# Patient Record
Sex: Male | Born: 1990
Health system: Southern US, Community
[De-identification: ages and names within clinical notes are randomized; demographics above are authoritative.]

## PROBLEM LIST (undated history)

## (undated) HISTORY — PX: HAND SURGERY: SHX662

---

## 2013-07-21 ENCOUNTER — Encounter (HOSPITAL_COMMUNITY): Payer: Self-pay | Admitting: Emergency Medicine

## 2013-07-21 ENCOUNTER — Emergency Department (HOSPITAL_COMMUNITY)
Admission: EM | Admit: 2013-07-21 | Discharge: 2013-07-21 | Disposition: A | Discharge status: Home / Self Care | Payer: BC Managed Care – PPO | Attending: Emergency Medicine | Admitting: Emergency Medicine

## 2013-07-21 DIAGNOSIS — F17200 Nicotine dependence, unspecified, uncomplicated: Secondary | ICD-10-CM | POA: Insufficient documentation

## 2013-07-21 DIAGNOSIS — K089 Disorder of teeth and supporting structures, unspecified: Secondary | ICD-10-CM | POA: Insufficient documentation

## 2013-07-21 DIAGNOSIS — K0520 Aggressive periodontitis, unspecified: Secondary | ICD-10-CM | POA: Insufficient documentation

## 2013-07-21 DIAGNOSIS — Z8781 Personal history of (healed) traumatic fracture: Secondary | ICD-10-CM | POA: Insufficient documentation

## 2013-07-21 DIAGNOSIS — K0889 Other specified disorders of teeth and supporting structures: Secondary | ICD-10-CM

## 2013-07-21 DIAGNOSIS — R51 Headache: Secondary | ICD-10-CM | POA: Insufficient documentation

## 2013-07-21 MED ORDER — OXYCODONE-ACETAMINOPHEN 5-325 MG PO TABS
2.0000 | ORAL_TABLET | Freq: Once | ORAL | Status: AC
  Administered 2013-07-21: 2 via ORAL
  Filled 2013-07-21: qty 2

## 2013-07-21 MED ORDER — OXYCODONE-ACETAMINOPHEN 5-325 MG PO TABS: 2.0000 | ORAL_TABLET | Freq: Four times a day (QID) | ORAL | Status: DC | PRN

## 2013-07-21 MED ORDER — PENICILLIN V POTASSIUM 500 MG PO TABS: 500.0000 mg | ORAL_TABLET | Freq: Three times a day (TID) | ORAL | Status: DC

## 2013-07-21 NOTE — ED Notes (Signed)
Patient states has dental pain/possible abscess left lower side.   Patient states broke a molar several years ago playing football.  Patient states has had pain off and on a while.   Patient states has not been to a dentist.

## 2013-07-21 NOTE — Discharge Instructions (Signed)

## 2013-07-21 NOTE — ED Provider Notes (Signed)
Medical screening examination/treatment/procedure(s) were performed by non-physician practitioner and as supervising physician I was immediately available for consultation/collaboration.   EKG Interpretation None      Devoria AlbeIva Lorilynn Lehr, MD, Armando GangFACEP   Ward GivensIva L Taksh Hjort, MD 07/21/13 [phone]

## 2013-07-21 NOTE — ED Provider Notes (Signed)
CSN: [id number]     Arrival date & time 07/21/13  1426 History  This chart was scribed for non-physician practitioner Roxy Horsemanobert Kaevion Sinclair, PA-C working with Ward GivensIva L Knapp, MD by Valera CastleSteven Perry, ED scribe. This patient was seen in room TR09C/TR09C and the patient's care was started at 3:20 PM.   Chief Complaint  Patient presents with  . Dental Pain   (Consider location/radiation/quality/duration/timing/severity/associated sxs/prior Treatment) The history is provided by the patient. No language interpreter was used.   Joel Mckenzie is a 23 y.o. male who presents to the Emergency Department with chief complaint of constantly painful abscess over his left lower mouth, with associated headache, onset yesterday. He reports h/o molar fx in the same area several years ago while playing football, having reoccurring abscesses to the area. He denies having been seen by dentist. He denies any other associated symptoms.   PCP - No PCP Per Patient  History reviewed. No pertinent past medical history. Past Surgical History  Procedure Laterality Date  . Hand surgery     No family history on file. History  Substance Use Topics  . Smoking status: Current Every Day Smoker -- 1.00 packs/day    Types: Cigarettes  . Smokeless tobacco: Not on file  . Alcohol Use: Yes    Review of Systems  Constitutional: Negative for fever.  HENT: Positive for dental problem (left lower) and facial swelling (left lower gum swelling).   Neurological: Positive for headaches.    Allergies  Review of patient's allergies indicates no known allergies.  Home Medications  No current outpatient prescriptions on file.  BP 135/78  Pulse 72  Temp(Src) 99.8 F (37.7 C) (Oral)  Resp 18  Ht 5\' 6"  (1.676 m)  Wt 180 lb (81.647 kg)  BMI 29.07 kg/m2  SpO2 98%  Physical Exam  Nursing note and vitals reviewed. Constitutional: He is oriented to person, place, and time. He appears well-developed and well-nourished. No distress.   HENT:  Head: Normocephalic and atraumatic.  Mouth/Throat:    Poor dentition throughout.  Affected tooth as diagrammed.  No signs of peritonsillar or tonsillar abscess.  No signs of gingival abscess. Oropharynx is clear and without exudates.  Uvula is midline.  Airway is intact. No signs of Ludwig's angina with palpation of oral and sublingual mucosa.   Eyes: EOM are normal.  Neck: Neck supple.  Cardiovascular: Normal rate.   Pulmonary/Chest: Effort normal. No respiratory distress.  Musculoskeletal: Normal range of motion.  Neurological: He is alert and oriented to person, place, and time.  Skin: Skin is warm and dry.  Psychiatric: He has a normal mood and affect. His behavior is normal.    ED Course  Procedures (including critical care time)  DIAGNOSTIC STUDIES: Oxygen Saturation is 98% on room air, normal by my interpretation.    COORDINATION OF CARE: 3:23 PM-Discussed treatment plan which includes I&D, antibiotic, and pain medicaiton with pt at bedside and pt agreed to plan.   INCISION AND DRAINAGE Performed by: Roxy HorsemanBROWNING, Olinda Nola Consent: Verbal consent obtained. Risks and benefits: risks, benefits and alternatives were discussed Type: abscess  Body area: left lower gingiva  Anesthesia: local infiltration  Incision was made with a scalpel.  Local anesthetic: Marcaine  Anesthetic total: 1.8 ml  Complexity: complex Blunt dissection to break up loculations  Drainage: purulent  Drainage amount: mild  Patient tolerance: Patient tolerated the procedure well with no immediate complications.    Labs Review Labs Reviewed - No data to display Imaging Review No results found.  EKG Interpretation None     Medications - No data to display  MDM   Final diagnoses:  Pain, dental   Patient with toothache.  Exam unconcerning for Ludwig's angina or spread of infection.  Will treat with penicillin and pain medicine.  Urged patient to follow-up with dentist.  I  attempted to I&D the gingival abscess, but there was only minimal discharge.  I discussed this with Dr. Deretha Emory, who recommends discharge to home with abx and dental follow-up.  Return precautions given. Patient understands and agrees with the plan.   I personally performed the services described in this documentation, which was scribed in my presence. The recorded information has been reviewed and is accurate.    Roxy Horseman, PA-C 07/21/13 [phone]

## 2014-10-19 ENCOUNTER — Emergency Department (HOSPITAL_BASED_OUTPATIENT_CLINIC_OR_DEPARTMENT_OTHER): Payer: Self-pay

## 2014-10-19 ENCOUNTER — Emergency Department (HOSPITAL_BASED_OUTPATIENT_CLINIC_OR_DEPARTMENT_OTHER)
Admission: EM | Admit: 2014-10-19 | Discharge: 2014-10-19 | Disposition: A | Discharge status: Home / Self Care | Payer: Self-pay | Attending: Emergency Medicine | Admitting: Emergency Medicine

## 2014-10-19 ENCOUNTER — Encounter (HOSPITAL_BASED_OUTPATIENT_CLINIC_OR_DEPARTMENT_OTHER): Payer: Self-pay | Admitting: *Deleted

## 2014-10-19 DIAGNOSIS — Z792 Long term (current) use of antibiotics: Secondary | ICD-10-CM | POA: Insufficient documentation

## 2014-10-19 DIAGNOSIS — Z720 Tobacco use: Secondary | ICD-10-CM | POA: Insufficient documentation

## 2014-10-19 DIAGNOSIS — J40 Bronchitis, not specified as acute or chronic: Secondary | ICD-10-CM | POA: Insufficient documentation

## 2014-10-19 LAB — RAPID STREP SCREEN (MED CTR MEBANE ONLY): Streptococcus, Group A Screen (Direct): NEGATIVE

## 2014-10-19 MED ORDER — PROMETHAZINE-DM 6.25-15 MG/5ML PO SYRP: 5.0000 mL | ORAL_SOLUTION | Freq: Four times a day (QID) | ORAL | Status: DC | PRN

## 2014-10-19 MED ORDER — ALBUTEROL SULFATE HFA 108 (90 BASE) MCG/ACT IN AERS
2.0000 | INHALATION_SPRAY | RESPIRATORY_TRACT | Status: DC | PRN
  Administered 2014-10-19: 2 via RESPIRATORY_TRACT
  Filled 2014-10-19: qty 6.7

## 2014-10-19 MED ORDER — PREDNISONE 50 MG PO TABS
60.0000 mg | ORAL_TABLET | Freq: Once | ORAL | Status: AC
  Administered 2014-10-19: 60 mg via ORAL
  Filled 2014-10-19 (×2): qty 1

## 2014-10-19 MED ORDER — AEROCHAMBER PLUS FLO-VU MEDIUM MISC
1.0000 | Freq: Once | Status: AC
  Administered 2014-10-19: 1
  Filled 2014-10-19: qty 1

## 2014-10-19 MED ORDER — PREDNISONE 50 MG PO TABS: 50.0000 mg | ORAL_TABLET | Freq: Every day | ORAL | Status: DC

## 2014-10-19 MED ORDER — IPRATROPIUM-ALBUTEROL 0.5-2.5 (3) MG/3ML IN SOLN
3.0000 mL | Freq: Once | RESPIRATORY_TRACT | Status: AC
Start: 2014-10-19 — End: 2014-10-19
  Administered 2014-10-19: 3 mL via RESPIRATORY_TRACT
  Filled 2014-10-19: qty 3

## 2014-10-19 MED ORDER — GUAIFENESIN ER 1200 MG PO TB12: 1.0000 | ORAL_TABLET | Freq: Two times a day (BID) | ORAL | Status: DC

## 2014-10-19 MED ORDER — ALBUTEROL (5 MG/ML) CONTINUOUS INHALATION SOLN
10.0000 mg/h | INHALATION_SOLUTION | RESPIRATORY_TRACT | Status: AC
  Administered 2014-10-19: 10 mg/h via RESPIRATORY_TRACT
  Filled 2014-10-19: qty 20

## 2014-10-19 MED ORDER — ACETAMINOPHEN 325 MG PO TABS
650.0000 mg | ORAL_TABLET | Freq: Once | ORAL | Status: AC
  Administered 2014-10-19: 650 mg via ORAL
  Filled 2014-10-19: qty 2

## 2014-10-19 NOTE — ED Notes (Signed)
Sob and cough. Recent URI.

## 2014-10-19 NOTE — ED Provider Notes (Signed)
CSN: [id number]     Arrival date & time 10/19/14  1644 History   First MD Initiated Contact with Patient 10/19/14 1653     Chief Complaint  Patient presents with  . Shortness of Breath     (Consider location/radiation/quality/duration/timing/severity/associated sxs/prior Treatment) HPI Patient presents to the emergency department with cough and shortness of breath started yesterday.  The patient states that he has had a recent upper respiratory illness.  Patient is a smoker.  He states that he does not have any past medical problems.  The patient denies chest pain, nausea, vomiting, weakness, dizziness, headache, blurred vision, back pain, neck pain, fever, abdominal pain, diarrhea, or syncope.  The patient states he has also has recently had a sore throat and some nasal congestion but the symptoms seemed to have subsided somewhat.  Patient states today that he was at work and he felt more short of breath with the cough. History reviewed. No pertinent past medical history. Past Surgical History  Procedure Laterality Date  . Hand surgery     No family history on file. History  Substance Use Topics  . Smoking status: Current Every Day Smoker -- 1.00 packs/day    Types: Cigarettes  . Smokeless tobacco: Not on file  . Alcohol Use: Yes    Review of Systems All other systems negative except as documented in the HPI. All pertinent positives and negatives as reviewed in the HPI.   Allergies  Review of patient's allergies indicates no known allergies.  Home Medications   Prior to Admission medications   Medication Sig Start Date End Date Taking? Authorizing Provider  oxyCODONE-acetaminophen (PERCOCET/ROXICET) 5-325 MG per tablet Take 2 tablets by mouth every 6 (six) hours as needed for severe pain. 07/21/13   Roxy Horsemanobert Browning, PA-C  penicillin v potassium (VEETID) 500 MG tablet Take 1 tablet (500 mg total) by mouth 3 (three) times daily. 07/21/13   Roxy Horsemanobert Browning, PA-C   BP 149/74 mmHg   Pulse 94  Temp(Src) 98.5 F (36.9 C) (Oral)  Resp 16  Ht 5\' 6"  (1.676 m)  Wt 193 lb (87.544 kg)  BMI 31.17 kg/m2  SpO2 100% Physical Exam  Constitutional: He appears well-developed and well-nourished. No distress.  HENT:  Head: Normocephalic and atraumatic.  Mouth/Throat: Oropharynx is clear and moist.  Eyes: Pupils are equal, round, and reactive to light.  Neck: Normal range of motion. Neck supple.  Cardiovascular: Normal rate, regular rhythm and normal heart sounds.  Exam reveals no gallop and no friction rub.   No murmur heard. Pulmonary/Chest: Effort normal. No respiratory distress. He has wheezes. He has no rales.  Musculoskeletal: He exhibits no edema.  Neurological: He is alert.  Skin: Skin is warm and dry. No rash noted. No erythema.  Psychiatric: He has a normal mood and affect. His behavior is normal.  Nursing note and vitals reviewed.   ED Course  Procedures (including critical care time) Labs Review Labs Reviewed  RAPID STREP SCREEN (NOT AT [facility])  CULTURE, GROUP A STREP    Imaging Review Dg Chest 2 View  10/19/2014   CLINICAL DATA:  Left-sided chest pain and shortness of breath for 1 day. Severe cough and vomiting.  EXAM: CHEST  2 VIEW  COMPARISON:  None.  FINDINGS: The heart size and mediastinal contours are within normal limits. Both lungs are clear. The visualized skeletal structures are unremarkable.  IMPRESSION: Negative.  No active cardiopulmonary disease.   Electronically Signed   By: Myles RosenthalJohn  Stahl M.D.   On:  10/19/2014 17:59   Patient most likely has bronchitis based on his history of present illness and physical exam findings, along with his x-ray results.  Patient is advised to return here as needed.  After an hour-long neb treatment.  The patient's breathing is vastly improved.  The patient feels considerably better.  Patient will be discharged home, told to return here as needed.  Patient's lungs are reexamined.  He does still have some congested breath  sounds but wheezing has dissipated and he is moving substantially more air at this time   Charlestine Night, PA-C 10/19/14 2015  Geoffery Lyons, MD 10/19/14 [phone]

## 2014-10-19 NOTE — Discharge Instructions (Signed)
Return here as needed.  Follow-up with the primary care doctor, increase your fluid intake and rest as much as possible, avoid smoking as much as possible as well

## 2014-10-21 LAB — CULTURE, GROUP A STREP

## 2016-04-03 ENCOUNTER — Encounter (HOSPITAL_BASED_OUTPATIENT_CLINIC_OR_DEPARTMENT_OTHER): Payer: Self-pay | Admitting: *Deleted

## 2016-04-03 ENCOUNTER — Emergency Department (HOSPITAL_BASED_OUTPATIENT_CLINIC_OR_DEPARTMENT_OTHER)
Admission: EM | Admit: 2016-04-03 | Discharge: 2016-04-03 | Disposition: A | Discharge status: Home / Self Care | Payer: Self-pay | Attending: Emergency Medicine | Admitting: Emergency Medicine

## 2016-04-03 DIAGNOSIS — A64 Unspecified sexually transmitted disease: Secondary | ICD-10-CM | POA: Insufficient documentation

## 2016-04-03 DIAGNOSIS — F17210 Nicotine dependence, cigarettes, uncomplicated: Secondary | ICD-10-CM | POA: Insufficient documentation

## 2016-04-03 LAB — URINALYSIS, ROUTINE W REFLEX MICROSCOPIC
Bilirubin Urine: NEGATIVE
Glucose, UA: NEGATIVE mg/dL
Ketones, ur: NEGATIVE mg/dL
Nitrite: NEGATIVE
Protein, ur: NEGATIVE mg/dL
SPECIFIC GRAVITY, URINE: 1.02 (ref 1.005–1.030)
pH: 6 (ref 5.0–8.0)

## 2016-04-03 LAB — URINE MICROSCOPIC-ADD ON

## 2016-04-03 MED ORDER — CEFTRIAXONE SODIUM 250 MG IJ SOLR
250.0000 mg | Freq: Once | INTRAMUSCULAR | Status: AC
  Administered 2016-04-03: 250 mg via INTRAMUSCULAR
  Filled 2016-04-03: qty 250

## 2016-04-03 MED ORDER — AZITHROMYCIN 250 MG PO TABS
1000.0000 mg | ORAL_TABLET | Freq: Once | ORAL | Status: AC
  Administered 2016-04-03: 1000 mg via ORAL
  Filled 2016-04-03: qty 4

## 2016-04-03 MED ORDER — LIDOCAINE HCL (PF) 1 % IJ SOLN
INTRAMUSCULAR | Status: AC
  Administered 2016-04-03: 5 mL
  Filled 2016-04-03: qty 5

## 2016-04-03 NOTE — ED Notes (Signed)
Pt d/c home. Follow up care discussed and pt verbalized understanding

## 2016-04-03 NOTE — ED Provider Notes (Signed)
[facility]   CSN: [id number] Arrival date & time: 04/03/16  1445     History   Chief Complaint Chief Complaint  Patient presents with  . Penile Discharge    HPI Joel Mckenzie is a 25 y.o. male.  HPI   25 year old male presents today with penile discharge. Patient reports symptoms started 2 days ago. He reports purulent discharge, denies any pain, rash, testicular swelling or discomfort. He denies any abdominal pain nausea vomiting or fever. Patient reports his sexual partner tested positive for chlamydia several days prior to symptom onset.   History reviewed. No pertinent past medical history.  There are no active problems to display for this patient.   Past Surgical History:  Procedure Laterality Date  . HAND SURGERY         Home Medications    Prior to Admission medications   Medication Sig Start Date End Date Taking? Authorizing Provider  Guaifenesin 1200 MG TB12 Take 1 tablet (1,200 mg total) by mouth 2 (two) times daily. 10/19/14   Charlestine Nighthristopher Lawyer, PA-C  oxyCODONE-acetaminophen (PERCOCET/ROXICET) 5-325 MG per tablet Take 2 tablets by mouth every 6 (six) hours as needed for severe pain. 07/21/13   Roxy Horsemanobert Browning, PA-C  penicillin v potassium (VEETID) 500 MG tablet Take 1 tablet (500 mg total) by mouth 3 (three) times daily. 07/21/13   Roxy Horsemanobert Browning, PA-C  predniSONE (DELTASONE) 50 MG tablet Take 1 tablet (50 mg total) by mouth daily. 10/19/14   Charlestine Nighthristopher Lawyer, PA-C  promethazine-dextromethorphan (PROMETHAZINE-DM) 6.25-15 MG/5ML syrup Take 5 mLs by mouth 4 (four) times daily as needed for cough. 10/19/14   Charlestine Nighthristopher Lawyer, PA-C    Family History No family history on file.  Social History Social History  Substance Use Topics  . Smoking status: Current Every Day Smoker    Packs/day: 1.00    Types: Cigarettes  . Smokeless tobacco: Never Used  . Alcohol use Yes     Allergies   Patient has no known allergies.   Review of  Systems Review of Systems  All other systems reviewed and are negative.   Physical Exam Updated Vital Signs BP 130/66 (BP Location: Right Arm)   Pulse 60   Temp 97.5 F (36.4 C) (Oral)   Resp 18   Ht 5' 5.5" (1.664 m)   Wt 87.5 kg   SpO2 99%   BMI 31.63 kg/m   Physical Exam  Constitutional: He is oriented to person, place, and time. He appears well-developed and well-nourished.  HENT:  Head: Normocephalic and atraumatic.  Eyes: Conjunctivae are normal. Pupils are equal, round, and reactive to light. Right eye exhibits no discharge. Left eye exhibits no discharge. No scleral icterus.  Neck: Normal range of motion. No JVD present. No tracheal deviation present.  Pulmonary/Chest: Effort normal. No stridor.  Neurological: He is alert and oriented to person, place, and time. Coordination normal.  Psychiatric: He has a normal mood and affect. His behavior is normal. Judgment and thought content normal.  Nursing note and vitals reviewed.   ED Treatments / Results  Labs (all labs ordered are listed, but only abnormal results are displayed) Labs Reviewed  URINALYSIS, ROUTINE W REFLEX MICROSCOPIC (NOT AT [facility]) - Abnormal; Notable for the following:       Result Value   APPearance CLOUDY (*)    Hgb urine dipstick SMALL (*)    Leukocytes, UA LARGE (*)    All other components within normal limits  URINE MICROSCOPIC-ADD ON - Abnormal; Notable for  the following:    Squamous Epithelial / LPF 0-5 (*)    Bacteria, UA RARE (*)    All other components within normal limits  HIV ANTIBODY (ROUTINE TESTING)  RPR  GC/CHLAMYDIA PROBE AMP ([facility]) NOT AT [facility]    EKG  EKG Interpretation None       Radiology No results found.  Procedures Procedures (including critical care time)  Medications Ordered in ED Medications  azithromycin (ZITHROMAX) tablet 1,000 mg (1,000 mg Oral Given 04/03/16 1539)  cefTRIAXone (ROCEPHIN) injection 250 mg (250 mg Intramuscular Given 04/03/16 1540)   lidocaine (PF) (XYLOCAINE) 1 % injection (5 mLs  Given 04/03/16 1539)     Initial Impression / Assessment and Plan / ED Course  I have reviewed the triage vital signs and the nursing notes.  Pertinent labs & imaging results that were available during my care of the patient were reviewed by me and considered in my medical decision making (see chart for details).  Clinical Course     Final Clinical Impressions(s) / ED Diagnoses   Final diagnoses:  STD (male)    Labs:  Imaging:  Consults:  Therapeutics:  Discharge Meds:   Assessment/Plan:  25 year old male presents today with penile discharge with known STD contact. He has no complaints of that discharge, he'll be treated prophylactically with urine analysis sent off. Return precautions given.       New Prescriptions New Prescriptions   No medications on file     Eyvonne MechanicJeffrey Haruki Arnold, PA-C 04/03/16 1721    Rolan BuccoMelanie Belfi, MD 04/03/16 2032

## 2016-04-03 NOTE — ED Triage Notes (Signed)
Penile discharge x 2 days 

## 2016-04-03 NOTE — Discharge Instructions (Signed)
Please read attached information. If you experience any new or worsening signs or symptoms please return to the emergency room for evaluation. Please follow-up with your primary care provider or specialist as discussed.  °

## 2016-04-04 LAB — RPR: RPR Ser Ql: NONREACTIVE

## 2016-04-04 LAB — HIV ANTIBODY (ROUTINE TESTING W REFLEX): HIV Screen 4th Generation wRfx: NONREACTIVE

## 2016-04-06 ENCOUNTER — Emergency Department (HOSPITAL_BASED_OUTPATIENT_CLINIC_OR_DEPARTMENT_OTHER)
Admission: EM | Admit: 2016-04-06 | Discharge: 2016-04-06 | Disposition: A | Discharge status: Home / Self Care | Payer: Self-pay | Attending: Emergency Medicine | Admitting: Emergency Medicine

## 2016-04-06 ENCOUNTER — Encounter (HOSPITAL_BASED_OUTPATIENT_CLINIC_OR_DEPARTMENT_OTHER): Payer: Self-pay | Admitting: *Deleted

## 2016-04-06 DIAGNOSIS — R079 Chest pain, unspecified: Secondary | ICD-10-CM

## 2016-04-06 DIAGNOSIS — R071 Chest pain on breathing: Secondary | ICD-10-CM | POA: Insufficient documentation

## 2016-04-06 DIAGNOSIS — F17210 Nicotine dependence, cigarettes, uncomplicated: Secondary | ICD-10-CM | POA: Insufficient documentation

## 2016-04-06 LAB — GC/CHLAMYDIA PROBE AMP (~~LOC~~) NOT AT ARMC
Chlamydia: NEGATIVE
Neisseria Gonorrhea: POSITIVE — AB

## 2016-04-06 MED ORDER — NAPROXEN 250 MG PO TABS
500.0000 mg | ORAL_TABLET | Freq: Once | ORAL | Status: AC
  Administered 2016-04-06: 500 mg via ORAL
  Filled 2016-04-06: qty 2

## 2016-04-06 MED ORDER — NAPROXEN 500 MG PO TABS: 500.0000 mg | ORAL_TABLET | Freq: Two times a day (BID) | ORAL | 0 refills | Status: DC

## 2016-04-06 NOTE — Discharge Instructions (Signed)
Take naproxen as prescribed. As we discussed there are many causes of chest pain including muscle strain or stress/anxiety. Follow up with your primary care provider. If you do not have one you may call one from the list provided. Return to the ER for new or worsening symptoms.

## 2016-04-06 NOTE — ED Notes (Signed)
Pt states the pain hurts with inspiration and certain movements. Pt endorses lifting objects at work which makes the pain worse. Pt also states he has been doing weightlifting exercises for his chest.

## 2016-04-06 NOTE — ED Provider Notes (Signed)
[facility]   CSN: [id number] Arrival date & time: 04/06/16  2134  By signing my name below, I, Vista Minkobert Ross, attest that this documentation has been prepared under the direction and in the presence of KeyCorpSerena Lachele Lievanos PA-C.  Electronically Signed: Vista Minkobert Ross, ED Scribe. 04/06/16. 11:04 PM.   History   Chief Complaint Chief Complaint  Patient presents with  . Chest Pain    HPI HPI Comments: Joel Mckenzie is a 25 y.o. male with no pertinent PMHx who presents to the Emergency Department complaining of intermittent left sided chest pain that started this morning. Pt reports exacerbated pain with deep inspiration. The pain is not present currently and is non-existent when sitting still. Pt does lift heavy objects at his work frequently but no acute injury noted. He has not taken any medication for this pain. Denies shortness of breath, leg swelling or Hx of DVT.  Denies n/v. Denies recent surgery. Denies hormone use. Denies fam history of CAD or sudden cardiac death at a young age.   The history is provided by the patient. No language interpreter was used.    History reviewed. No pertinent past medical history.  There are no active problems to display for this patient.   Past Surgical History:  Procedure Laterality Date  . HAND SURGERY       Home Medications    Prior to Admission medications   Medication Sig Start Date End Date Taking? Authorizing Provider  Guaifenesin 1200 MG TB12 Take 1 tablet (1,200 mg total) by mouth 2 (two) times daily. 10/19/14   Charlestine Nighthristopher Lawyer, PA-C  promethazine-dextromethorphan (PROMETHAZINE-DM) 6.25-15 MG/5ML syrup Take 5 mLs by mouth 4 (four) times daily as needed for cough. 10/19/14   Charlestine Nighthristopher Lawyer, PA-C    Family History History reviewed. No pertinent family history.  Social History Social History  Substance Use Topics  . Smoking status: Current Every Day Smoker    Packs/day: 1.00    Types: Cigarettes  .  Smokeless tobacco: Never Used  . Alcohol use Yes   Allergies   Patient has no known allergies.   Review of Systems Review of Systems 10 Systems reviewed and all are negative for acute change except as noted in the HPI.  Physical Exam Updated Vital Signs BP 134/67   Pulse 77   Temp 98 F (36.7 C)   Resp 18   Wt 193 lb (87.5 kg)   SpO2 98%   BMI 31.63 kg/m   Physical Exam  Constitutional: He is oriented to person, place, and time.  HENT:  Right Ear: External ear normal.  Left Ear: External ear normal.  Nose: Nose normal.  Mouth/Throat: Oropharynx is clear and moist. No oropharyngeal exudate.  Eyes: Conjunctivae are normal.  Neck: Neck supple.  Cardiovascular: Normal rate, regular rhythm, normal heart sounds and intact distal pulses.   Pulmonary/Chest: Effort normal and breath sounds normal. No respiratory distress. He has no wheezes.  Abdominal: Soft. Bowel sounds are normal. He exhibits no distension. There is no tenderness. There is no rebound and no guarding.  Musculoskeletal: He exhibits no edema.  Lymphadenopathy:    He has no cervical adenopathy.  Neurological: He is alert and oriented to person, place, and time. No cranial nerve deficit.  Skin: Skin is warm and dry.  Psychiatric: He has a normal mood and affect.  Nursing note and vitals reviewed.    ED Treatments / Results  DIAGNOSTIC STUDIES: Oxygen Saturation is 98% on RA, normal by my interpretation.  COORDINATION OF CARE: 11:02 PM-Will discharge and order Naproxen. Discussed treatment plan with pt at bedside and pt agreed to plan.   Labs (all labs ordered are listed, but only abnormal results are displayed) Labs Reviewed - No data to display  EKG  EKG Interpretation  Date/Time:  Monday April 06 2016 21:40:11 EST Ventricular Rate:  64 PR Interval:  140 QRS Duration: 108 QT Interval:  380 QTC Calculation: 392 R Axis:   77 Text Interpretation:  Normal sinus rhythm RSR' or QR pattern in V1  suggests right ventricular conduction delay Borderline ECG agree. no STEMI.NO OLD COMPARISON. Confirmed by Donnald GarrePfeiffer, MD, Lebron ConnersMarcy [id number]) on 04/06/2016 10:01:19 PM       Radiology No results found.  Procedures Procedures (including critical care time)  Medications Ordered in ED Medications - No data to display   Initial Impression / Assessment and Plan / ED Course  I have reviewed the triage vital signs and the nursing notes.  Pertinent labs & imaging results that were available during my care of the patient were reviewed by me and considered in my medical decision making (see chart for details).  Clinical Course     EKG nonacute. PERC 0 doubt PE. Doubt ACS in this 25 year old male with no significant risk factors other than cigarette use. He does lift and pull heavy things for work and has been working out his chest. He also endorses increased stress and anxiety lately as he recently gained full custody of his son. Discussed with pt there are many etiologies of chest pain including anxiety and msk pain. Will trial course of naproxen. Pt is pain free currently. Encouraged /fu with PCP. Resource guide given. ER return precautions given.  Final Clinical Impressions(s) / ED Diagnoses   Final diagnoses:  Chest pain, unspecified type    New Prescriptions Discharge Medication List as of 04/06/2016 11:05 PM    START taking these medications   Details  naproxen (NAPROSYN) 500 MG tablet Take 1 tablet (500 mg total) by mouth 2 (two) times daily., Starting Mon 04/06/2016, Print       I personally performed the services described in this documentation, which was scribed in my presence. The recorded information has been reviewed and is accurate.    Joel CoriaSerena Y Nailah Luepke, PA-C 04/06/16 [phone]    Arby BarretteMarcy Pfeiffer, MD 04/09/16 [phone]

## 2016-04-06 NOTE — ED Triage Notes (Signed)
Pt c/o left side chest pain x 1 day , increased pain with deep breathing and movt

## 2016-04-06 NOTE — ED Notes (Signed)
Pt verbalizes understanding of d/c instructions and denies any further needs at this time. 

## 2016-04-27 ENCOUNTER — Encounter (HOSPITAL_BASED_OUTPATIENT_CLINIC_OR_DEPARTMENT_OTHER): Payer: Self-pay | Admitting: *Deleted

## 2016-04-27 ENCOUNTER — Emergency Department (HOSPITAL_BASED_OUTPATIENT_CLINIC_OR_DEPARTMENT_OTHER)
Admission: EM | Admit: 2016-04-27 | Discharge: 2016-04-27 | Disposition: A | Discharge status: Home / Self Care | Payer: Self-pay | Attending: Emergency Medicine | Admitting: Emergency Medicine

## 2016-04-27 DIAGNOSIS — K029 Dental caries, unspecified: Secondary | ICD-10-CM | POA: Insufficient documentation

## 2016-04-27 DIAGNOSIS — F17210 Nicotine dependence, cigarettes, uncomplicated: Secondary | ICD-10-CM | POA: Insufficient documentation

## 2016-04-27 MED ORDER — PENICILLIN V POTASSIUM 500 MG PO TABS: 500.0000 mg | ORAL_TABLET | Freq: Four times a day (QID) | ORAL | 0 refills | Status: AC

## 2016-04-27 MED ORDER — IBUPROFEN 400 MG PO TABS
400.0000 mg | ORAL_TABLET | Freq: Once | ORAL | Status: AC
  Administered 2016-04-27: 400 mg via ORAL
  Filled 2016-04-27: qty 1

## 2016-04-27 MED FILL — PENICILLIN VK 500 MG TABLET: 500 | 7 days supply | Qty: 28 | Fill #0

## 2016-04-27 NOTE — ED Triage Notes (Signed)
Pt c/o dental pain x 3 days.

## 2016-04-27 NOTE — Discharge Instructions (Signed)
Take Tylenol or Advil for pain. Take the penicillin as prescribed Call Dr.Farless's office tomorrow morning to schedule next available appointment. Tell office staff that you were seen here when scheduling the appointment.

## 2016-04-27 NOTE — ED Provider Notes (Signed)
[facility] Provider Note   CSN: [id number] Arrival date & time: 04/27/16  1633   By signing my name below, I, Teofilo PodMatthew P. Jamison, attest that this documentation has been prepared under the direction and in the presence of Doug SouSam Syncere Kaminski M.D. Electronically Signed: Teofilo PodMatthew P. Jamison, ED Scribe. 04/27/2016. 4:45 PM.   History   Chief Complaint Chief Complaint  Patient presents with  . Dental Pain    The history is provided by the patient. No language interpreter was used.   HPI Comments:  Joel Mckenzie is a 25 y.o. male who presents to the Emergency Department complaining of constant lower dental pain x 3 days. Pt states that he has been unable to chew or eat.Due to pain on right side of mouth. Pt complains of an associated headache, , and an elevated temperature of 100.2 measured at home.. Pt states that he has not been to a dentist in 2 years. Pt is a smoker, uses marijuana, and drinks EtOH occasionally. Pt has taken excedrin with no relief. Pt denies other associated symptoms. . No nausea or vomiting. Pain worse with chewing. Not improved by anything. Treated with Excedrin last dose 12 noon today  History reviewed. No pertinent past medical history.  There are no active problems to display for this patient.   Past Surgical History:  Procedure Laterality Date  . HAND SURGERY         Home Medications    Prior to Admission medications   Not on File    Family History History reviewed. No pertinent family history.  Social History Social History  Substance Use Topics  . Smoking status: Current Every Day Smoker    Packs/day: 1.00    Types: Cigarettes  . Smokeless tobacco: Never Used  . Alcohol use Yes     Allergies   Patient has no known allergies.   Review of Systems Review of Systems  Constitutional: Negative.   HENT: Positive for dental problem.      Physical Exam Updated Vital Signs BP 136/77   Pulse 61   Temp 98.5 F (36.9 C)    Resp 18   Ht 5\' 5"  (1.651 m)   Wt 193 lb (87.5 kg)   SpO2 96%   BMI 32.12 kg/m   Physical Exam  Constitutional: He is oriented to person, place, and time. He appears well-developed and well-nourished. No distress.  HENT:  Head: Normocephalic and atraumatic.  Tooth #32 decayed, tender. No surrounding fluctuance or swelling of gingiva. No trismus.  Eyes: EOM are normal.  Neck: Neck supple. No tracheal deviation present. No thyromegaly present.  No tenderness of submandibular area   Cardiovascular: Normal rate.   No murmur heard. Pulmonary/Chest: Effort normal.  Abdominal: He exhibits no distension.  Musculoskeletal: Normal range of motion. He exhibits no edema or tenderness.  Lymphadenopathy:    He has no cervical adenopathy.  Neurological: He is alert and oriented to person, place, and time. Coordination normal.  Skin: Skin is warm and dry. No rash noted.  Psychiatric: He has a normal mood and affect.  Nursing note and vitals reviewed.    ED Treatments / Results  DIAGNOSTIC STUDIES:  Oxygen Saturation is 96% on RA, normal by my interpretation.    COORDINATION OF CARE:  4:45 PM Discussed treatment plan with pt at bedside and pt agreed to plan.   Labs (all labs ordered are listed, but only abnormal results are displayed) Labs Reviewed - No data to display  EKG  EKG Interpretation  None       Radiology No results found.  Procedures Procedures (including critical care time)  Medications Ordered in ED Medications - No data to display   Initial Impression / Assessment and Plan / ED Course  I have reviewed the triage vital signs and the nursing notes.  Pertinent labs & imaging results that were available during my care of the patient were reviewed by me and considered in my medical decision making (see chart for details).  Clinical Course     Ibuprofen administered here prior to discharge. Suggest Tylenol or Advil for pain. Dental referral Dr. Leanord AsalFarless.  Prescription Pen-Vee K  Final Clinical Impressions(s) / ED Diagnoses  Diagnosis dental pain Final diagnoses:  None    New Prescriptions New Prescriptions   No medications on file  I personally performed the services described in this documentation, which was scribed in my presence. The recorded information has been reviewed and considered.     Doug SouSam Marijayne Rauth, MD 04/27/16 [phone]

## 2016-08-22 ENCOUNTER — Emergency Department (HOSPITAL_BASED_OUTPATIENT_CLINIC_OR_DEPARTMENT_OTHER)
Admission: EM | Admit: 2016-08-22 | Discharge: 2016-08-22 | Disposition: A | Discharge status: Home / Self Care | Payer: Self-pay | Attending: Emergency Medicine | Admitting: Emergency Medicine

## 2016-08-22 ENCOUNTER — Encounter (HOSPITAL_BASED_OUTPATIENT_CLINIC_OR_DEPARTMENT_OTHER): Payer: Self-pay | Admitting: *Deleted

## 2016-08-22 DIAGNOSIS — W260XXA Contact with knife, initial encounter: Secondary | ICD-10-CM | POA: Insufficient documentation

## 2016-08-22 DIAGNOSIS — Y93G3 Activity, cooking and baking: Secondary | ICD-10-CM | POA: Insufficient documentation

## 2016-08-22 DIAGNOSIS — Z23 Encounter for immunization: Secondary | ICD-10-CM | POA: Insufficient documentation

## 2016-08-22 DIAGNOSIS — S61012A Laceration without foreign body of left thumb without damage to nail, initial encounter: Secondary | ICD-10-CM | POA: Insufficient documentation

## 2016-08-22 DIAGNOSIS — Y998 Other external cause status: Secondary | ICD-10-CM | POA: Insufficient documentation

## 2016-08-22 DIAGNOSIS — F17210 Nicotine dependence, cigarettes, uncomplicated: Secondary | ICD-10-CM | POA: Insufficient documentation

## 2016-08-22 DIAGNOSIS — Y92010 Kitchen of single-family (private) house as the place of occurrence of the external cause: Secondary | ICD-10-CM | POA: Insufficient documentation

## 2016-08-22 MED ORDER — TETANUS-DIPHTH-ACELL PERTUSSIS 5-2.5-18.5 LF-MCG/0.5 IM SUSP
0.5000 mL | Freq: Once | INTRAMUSCULAR | Status: AC
  Administered 2016-08-22: 0.5 mL via INTRAMUSCULAR
  Filled 2016-08-22: qty 0.5

## 2016-08-22 NOTE — ED Notes (Signed)
Wound to LT thumb cleaned with Saf-Clens; Bacitracin applied, then bulky dressing applied, secured by Coban.

## 2016-08-22 NOTE — ED Provider Notes (Signed)
[facility]   CSN: [id number] Arrival date & time: 08/22/16  1553  By signing my name below, I, Nelwyn Salisbury, attest that this documentation has been prepared under the direction and in the presence of Rolan Bucco, MD . Electronically Signed: Nelwyn Salisbury, Scribe. 08/22/2016. 6:47 PM.  History   Chief Complaint Chief Complaint  Patient presents with  . Laceration   The history is provided by the patient. No language interpreter was used.    HPI Comments:  Joel Mckenzie is an otherwise healthy 26 y.o. male who presents to the Emergency Department after sustaining a laceration to his left-first digit ~24 hours ago. Pt was cooking in the kitchen when his finger slipped and he cut his finger. Bleeding controlled prior to arrival. Denies any numbness or weakness. Tetanus not UTD.   History reviewed. No pertinent past medical history.  There are no active problems to display for this patient.   Past Surgical History:  Procedure Laterality Date  . HAND SURGERY         Home Medications    Prior to Admission medications   Not on File    Family History No family history on file.  Social History Social History  Substance Use Topics  . Smoking status: Current Every Day Smoker    Packs/day: 1.00    Types: Cigarettes  . Smokeless tobacco: Never Used  . Alcohol use No     Allergies   Patient has no known allergies.   Review of Systems Review of Systems  Constitutional: Negative for fever.  Gastrointestinal: Negative for nausea and vomiting.  Musculoskeletal: Negative for back pain and neck pain.  Skin: Positive for wound.  Neurological: Negative for weakness, numbness and headaches.     Physical Exam Updated Vital Signs BP (!) 142/67 (BP Location: Right Arm)   Pulse 66   Temp 98.4 F (36.9 C) (Oral)   Resp 18   Ht 5' 5.5" (1.664 m)   Wt 195 lb (88.5 kg)   SpO2 99%   BMI 31.96 kg/m   Physical Exam  Constitutional: He is  oriented to person, place, and time. He appears well-developed and well-nourished.  HENT:  Head: Normocephalic and atraumatic.  Neck: Normal range of motion. Neck supple.  Cardiovascular: Normal rate.   Pulmonary/Chest: Effort normal.  Neurological: He is alert and oriented to person, place, and time.  Skin: Skin is warm and dry.  1.5cm laceration to distal aspect of the left thumb on the volar surface. No drainage or bleeding. Normal flexion and extension of the IP joint. Normal sensation distally. Cap refill <2 distally. No nail involvement.   Psychiatric: He has a normal mood and affect.     ED Treatments / Results  DIAGNOSTIC STUDIES:  Oxygen Saturation is 97% on RA, normal by my interpretation.    COORDINATION OF CARE:  7:11 PM Discussed treatment plan with pt at bedside which includes keeping the wound clean and dry and pt agreed to plan.  Labs (all labs ordered are listed, but only abnormal results are displayed) Labs Reviewed - No data to display  EKG  EKG Interpretation None       Radiology No results found.  Procedures Procedures (including critical care time)  Medications Ordered in ED Medications  Tdap (BOOSTRIX) injection 0.5 mL (not administered)     Initial Impression / Assessment and Plan / ED Course  I have reviewed the triage vital signs and the nursing notes.  Pertinent labs & imaging  results that were available during my care of the patient were reviewed by me and considered in my medical decision making (see chart for details).     Patient presents with a thumb laceration it's greater than 24 hours old. There is no active bleeding. No evidence of a tendon injury. He was given wound care instructions and advised to return if he has any signs of infection. His tetanus shot was updated.  Final Clinical Impressions(s) / ED Diagnoses   Final diagnoses:  Laceration of left thumb without foreign body without damage to nail, initial encounter     New Prescriptions New Prescriptions   No medications on file  I personally performed the services described in this documentation, which was scribed in my presence.  The recorded information has been reviewed and considered.     Rolan Bucco, MD 08/22/16 [phone]

## 2016-08-22 NOTE — ED Triage Notes (Signed)
Pt reports he was sharpening a kitchen knife last night when it slipped. Pt has a lac to L thumb -- bleeding controlled.

## 2017-12-10 ENCOUNTER — Encounter (HOSPITAL_BASED_OUTPATIENT_CLINIC_OR_DEPARTMENT_OTHER): Payer: Self-pay | Admitting: Emergency Medicine

## 2017-12-10 ENCOUNTER — Other Ambulatory Visit: Payer: Self-pay

## 2017-12-10 ENCOUNTER — Emergency Department (HOSPITAL_BASED_OUTPATIENT_CLINIC_OR_DEPARTMENT_OTHER)
Admission: EM | Admit: 2017-12-10 | Discharge: 2017-12-10 | Disposition: A | Discharge status: Home / Self Care | Payer: Self-pay | Attending: Emergency Medicine | Admitting: Emergency Medicine

## 2017-12-10 DIAGNOSIS — Z202 Contact with and (suspected) exposure to infections with a predominantly sexual mode of transmission: Secondary | ICD-10-CM | POA: Insufficient documentation

## 2017-12-10 DIAGNOSIS — F17210 Nicotine dependence, cigarettes, uncomplicated: Secondary | ICD-10-CM | POA: Insufficient documentation

## 2017-12-10 LAB — URINALYSIS, ROUTINE W REFLEX MICROSCOPIC
Bilirubin Urine: NEGATIVE
GLUCOSE, UA: NEGATIVE mg/dL
Hgb urine dipstick: NEGATIVE
KETONES UR: NEGATIVE mg/dL
LEUKOCYTES UA: NEGATIVE
NITRITE: NEGATIVE
Protein, ur: NEGATIVE mg/dL
Specific Gravity, Urine: 1.02 (ref 1.005–1.030)
pH: 7 (ref 5.0–8.0)

## 2017-12-10 MED ORDER — METRONIDAZOLE 500 MG PO TABS
2000.0000 mg | ORAL_TABLET | Freq: Once | ORAL | Status: AC
  Administered 2017-12-10: 2000 mg via ORAL
  Filled 2017-12-10: qty 4

## 2017-12-10 MED ORDER — CEFTRIAXONE SODIUM 250 MG IJ SOLR
250.0000 mg | Freq: Once | INTRAMUSCULAR | Status: AC
  Administered 2017-12-10: 250 mg via INTRAMUSCULAR
  Filled 2017-12-10: qty 250

## 2017-12-10 MED ORDER — AZITHROMYCIN 250 MG PO TABS
1000.0000 mg | ORAL_TABLET | Freq: Once | ORAL | Status: AC
  Administered 2017-12-10: 1000 mg via ORAL
  Filled 2017-12-10: qty 4

## 2017-12-10 MED ORDER — LIDOCAINE HCL (PF) 1 % IJ SOLN
INTRAMUSCULAR | Status: AC
  Administered 2017-12-10: 5 mL
  Filled 2017-12-10: qty 5

## 2017-12-10 NOTE — ED Provider Notes (Signed)
[facility] EMERGENCY DEPARTMENT Provider Note   CSN: [id number] Arrival date & time: 12/10/17  1648     History   Chief Complaint Chief Complaint  Patient presents with  . Exposure to STD    HPI Joel Mckenzie is a 27 y.o. male here for evaluation of STD treatment and testing.  States that his ex-girlfriend notified him today that she was treated for STDs recently and she had trichomonas.  Patient has been with this person for 2 years.  He is sexually active with females only with inconsistent condom use.  Thinks he may have noticed some penile discharge today while going to the bathroom.  Has history of STDs in the past.  Denies any fevers, chills, abdominal pain, dysuria, hematuria, testicular pain or swelling, lesions.  HPI  History reviewed. No pertinent past medical history.  There are no active problems to display for this patient.   Past Surgical History:  Procedure Laterality Date  . HAND SURGERY          Home Medications    Prior to Admission medications   Not on File    Family History No family history on file.  Social History Social History   Tobacco Use  . Smoking status: Current Every Day Smoker    Packs/day: 1.00    Types: Cigarettes  . Smokeless tobacco: Never Used  Substance Use Topics  . Alcohol use: No    Frequency: Never  . Drug use: Yes    Frequency: 7.0 times per week    Types: Marijuana     Allergies   Patient has no known allergies.   Review of Systems Review of Systems  Genitourinary: Positive for discharge.  All other systems reviewed and are negative.    Physical Exam Updated Vital Signs BP (!) 150/80 (BP Location: Left Arm)   Pulse 87   Temp 98.7 F (37.1 C) (Oral)   Resp 18   Ht 5\' 6"  (1.676 m)   Wt 95.7 kg (211 lb)   SpO2 100%   BMI 34.06 kg/m   Physical Exam  Constitutional: He is oriented to person, place, and time. He appears well-developed and well-nourished.  Non-toxic appearance.  HENT:   Head: Normocephalic.  Right Ear: External ear normal.  Left Ear: External ear normal.  Nose: Nose normal.  Eyes: Conjunctivae and EOM are normal.  Neck: Full passive range of motion without pain.  Cardiovascular: Normal rate.  Pulmonary/Chest: Effort normal. No tachypnea. No respiratory distress.  Genitourinary: Discharge found.  Genitourinary Comments:  External genitalia normal without erythema, edema, tenderness or lesions.  Non circumcised male.  No groin lymphadenopathy.  Scant clear meatus discharge, some blood on swab noted. Glans and shaft smooth without tenderness, lesions, masses or deformity.  Scrotum without lesions or edema.  Non tender testicles. Epididymis and spermatic cord without tenderness or masses, bilaterally. Cremasteric reflex intact.  Musculoskeletal: Normal range of motion.  Neurological: He is alert and oriented to person, place, and time.  Skin: Skin is warm and dry. Capillary refill takes less than 2 seconds.  Psychiatric: His behavior is normal. Thought content normal.     ED Treatments / Results  Labs (all labs ordered are listed, but only abnormal results are displayed) Labs Reviewed  URINE CULTURE  URINALYSIS, ROUTINE W REFLEX MICROSCOPIC  RPR  HIV ANTIBODY (ROUTINE TESTING)  GC/CHLAMYDIA PROBE AMP ([place]) NOT AT [facility]    EKG None  Radiology No results found.  Procedures Procedures (including critical care time)  Medications Ordered in ED Medications  cefTRIAXone (ROCEPHIN) injection 250 mg (250 mg Intramuscular Given 12/10/17 1840)  azithromycin (ZITHROMAX) tablet 1,000 mg (1,000 mg Oral Given 12/10/17 1839)  lidocaine (PF) (XYLOCAINE) 1 % injection (5 mLs  Given 12/10/17 1841)  metroNIDAZOLE (FLAGYL) tablet 2,000 mg (2,000 mg Oral Given 12/10/17 1852)     Initial Impression / Assessment and Plan / ED Course  I have reviewed the triage vital signs and the nursing notes.  Pertinent labs & imaging results that were available  during my care of the patient were reviewed by me and considered in my medical decision making (see chart for details).     Pt is a 27 y.o. male who presents for possible trichomonas exposure.  Noticed discharge today. Pt otherwise asymptomatic.  On exam, abdomen nontender. GU exam with scant clear meatus discharge and blood tinged discharge on swab.  UA without trichomonas however his only sexual partner tested positive for trich so he was treated empirically with flagyl, ceftriaxone, azithro in ED. Provided STD education.  Encouraged pt to notify partners for testing and treatment. Discussed return precautions. He requested HIV testing, ordered HIV and RPR. Final Clinical Impressions(s) / ED Diagnoses   Final diagnoses:  STD exposure    ED Discharge Orders    None       Jerrell MylarGibbons, Swayze Pries J, PA-C 12/10/17 2207    Gwyneth SproutPlunkett, Whitney, MD 12/10/17 [phone]

## 2017-12-10 NOTE — ED Triage Notes (Signed)
Pt reports exposure to trichomonas. Denies symptoms.

## 2017-12-10 NOTE — Discharge Instructions (Addendum)
You were seen in the emergency department for possible STD exposure.  Your urine does not show trichomonas, however given your possible recent exposure we will treat you for this.  Additionally you were treated for gonorrhea and chlamydia.  The gonorrhea and chlamydia results, HIV and syphilis are still pending and return in the next 48 hours.  You will be called and notified if the results are positive.  You can assume that they were negative for few did not receive a phone call from us.  However, you can check my chart to look at the results.  Notify any of your sexual partners as they should obtain testing and treatment.  Did not have sexual encounters for the next 7 days.  Return to the ER for burning with urination, blood in your urine, penile discharge, testicular pain or swelling, fevers, abdominal pain.

## 2017-12-12 LAB — URINE CULTURE

## 2017-12-12 LAB — RPR: RPR Ser Ql: NONREACTIVE

## 2017-12-12 LAB — HIV ANTIBODY (ROUTINE TESTING W REFLEX): HIV SCREEN 4TH GENERATION: NONREACTIVE

## 2017-12-13 LAB — GC/CHLAMYDIA PROBE AMP (~~LOC~~) NOT AT ARMC
CHLAMYDIA, DNA PROBE: POSITIVE — AB
Neisseria Gonorrhea: NEGATIVE

## 2018-04-06 ENCOUNTER — Encounter (HOSPITAL_BASED_OUTPATIENT_CLINIC_OR_DEPARTMENT_OTHER): Payer: Self-pay | Admitting: Emergency Medicine

## 2018-04-06 ENCOUNTER — Emergency Department (HOSPITAL_BASED_OUTPATIENT_CLINIC_OR_DEPARTMENT_OTHER)
Admission: EM | Admit: 2018-04-06 | Discharge: 2018-04-06 | Disposition: A | Discharge status: Home / Self Care | Payer: Self-pay | Attending: Emergency Medicine | Admitting: Emergency Medicine

## 2018-04-06 ENCOUNTER — Other Ambulatory Visit: Payer: Self-pay

## 2018-04-06 ENCOUNTER — Emergency Department (HOSPITAL_BASED_OUTPATIENT_CLINIC_OR_DEPARTMENT_OTHER): Payer: Self-pay

## 2018-04-06 DIAGNOSIS — F1210 Cannabis abuse, uncomplicated: Secondary | ICD-10-CM | POA: Insufficient documentation

## 2018-04-06 DIAGNOSIS — J069 Acute upper respiratory infection, unspecified: Secondary | ICD-10-CM | POA: Insufficient documentation

## 2018-04-06 DIAGNOSIS — B9789 Other viral agents as the cause of diseases classified elsewhere: Secondary | ICD-10-CM | POA: Insufficient documentation

## 2018-04-06 DIAGNOSIS — F17210 Nicotine dependence, cigarettes, uncomplicated: Secondary | ICD-10-CM | POA: Insufficient documentation

## 2018-04-06 LAB — CBG MONITORING, ED: GLUCOSE-CAPILLARY: 86 mg/dL (ref 70–99)

## 2018-04-06 MED ORDER — IBUPROFEN 800 MG PO TABS
800.0000 mg | ORAL_TABLET | Freq: Once | ORAL | Status: AC
  Administered 2018-04-06: 800 mg via ORAL
  Filled 2018-04-06: qty 1

## 2018-04-06 MED ORDER — ACETAMINOPHEN 500 MG PO TABS
1000.0000 mg | ORAL_TABLET | Freq: Once | ORAL | Status: AC
  Administered 2018-04-06: 1000 mg via ORAL
  Filled 2018-04-06: qty 2

## 2018-04-06 MED ORDER — BENZONATATE 100 MG PO CAPS: 100.0000 mg | ORAL_CAPSULE | Freq: Three times a day (TID) | ORAL | 0 refills | Status: AC

## 2018-04-06 MED ORDER — BENZONATATE 100 MG PO CAPS
100.0000 mg | ORAL_CAPSULE | Freq: Once | ORAL | Status: AC
  Administered 2018-04-06: 100 mg via ORAL
  Filled 2018-04-06: qty 1

## 2018-04-06 NOTE — ED Triage Notes (Signed)
Pt c/o cough x 2d; now sts it hurts in his back when he coughs; started wheezing yesterday; no hx of asthma

## 2018-04-06 NOTE — ED Provider Notes (Signed)
[facility] EMERGENCY DEPARTMENT Provider Note   CSN: [id number] Arrival date & time: 04/06/18  1005     History   Chief Complaint Chief Complaint  Patient presents with  . Cough    HPI Joel Mckenzie is a 27 y.o. male.  27 yo M with a chief complaint of cough and right-sided chest pain.  Going on for the past 3 or 4 days.  Having subjective fevers and chills at home.  Unsure of sick contacts.  Denies trauma to the chest.  Denies hemoptysis.  The history is provided by the patient.  Cough  This is a new problem. Associated symptoms include chills. Pertinent negatives include no chest pain, no headaches, no myalgias and no shortness of breath.  Illness  This is a new problem. The current episode started less than 1 hour ago. The problem occurs constantly. The problem has not changed since onset.Pertinent negatives include no chest pain, no abdominal pain, no headaches and no shortness of breath. Nothing aggravates the symptoms. Nothing relieves the symptoms. He has tried nothing for the symptoms. The treatment provided no relief.    History reviewed. No pertinent past medical history.  There are no active problems to display for this patient.   Past Surgical History:  Procedure Laterality Date  . HAND SURGERY          Home Medications    Prior to Admission medications   Medication Sig Start Date End Date Taking? Authorizing Provider  benzonatate (TESSALON) 100 MG capsule Take 1 capsule (100 mg total) by mouth every 8 (eight) hours. 04/06/18   Melene Plan, DO    Family History No family history on file.  Social History Social History   Tobacco Use  . Smoking status: Current Every Day Smoker    Packs/day: 1.00    Types: Cigarettes  . Smokeless tobacco: Never Used  Substance Use Topics  . Alcohol use: No    Frequency: Never  . Drug use: Yes    Frequency: 7.0 times per week    Types: Marijuana     Allergies   Patient has no known  allergies.   Review of Systems Review of Systems  Constitutional: Positive for chills and fever (subjective).  HENT: Positive for congestion. Negative for facial swelling.   Eyes: Negative for discharge and visual disturbance.  Respiratory: Positive for cough. Negative for shortness of breath.   Cardiovascular: Negative for chest pain and palpitations.  Gastrointestinal: Negative for abdominal pain, diarrhea and vomiting.  Musculoskeletal: Negative for arthralgias and myalgias.  Skin: Negative for color change and rash.  Neurological: Negative for tremors, syncope and headaches.  Psychiatric/Behavioral: Negative for confusion and dysphoric mood.     Physical Exam Updated Vital Signs BP 131/81   Pulse 76   Temp 98.4 F (36.9 C) (Oral)   Resp 18   Ht 5\' 6"  (1.676 m)   Wt 89.8 kg   SpO2 97%   BMI 31.96 kg/m   Physical Exam  Constitutional: He is oriented to person, place, and time. He appears well-developed and well-nourished.  HENT:  Head: Normocephalic and atraumatic.  Eyes: Pupils are equal, round, and reactive to light. EOM are normal.  Neck: Normal range of motion. Neck supple. No JVD present.  Cardiovascular: Normal rate and regular rhythm. Exam reveals no gallop and no friction rub.  No murmur heard. Pulmonary/Chest: No respiratory distress. He has no wheezes. He exhibits tenderness.  TTP about the right lateral chest wall, reproduces pain  Abdominal: He  exhibits no distension and no mass. There is no tenderness. There is no rebound and no guarding.  Musculoskeletal: Normal range of motion.  Neurological: He is alert and oriented to person, place, and time.  Skin: No rash noted. No pallor.  Psychiatric: He has a normal mood and affect. His behavior is normal.  Nursing note and vitals reviewed.    ED Treatments / Results  Labs (all labs ordered are listed, but only abnormal results are displayed) Labs Reviewed  CBG MONITORING, ED     EKG None  Radiology Dg Chest 2 View  Result Date: 04/06/2018 CLINICAL DATA:  Cough and fever for 2 days. EXAM: CHEST - 2 VIEW COMPARISON:  10/19/2014 FINDINGS: The heart size and mediastinal contours are within normal limits. Both lungs are clear. The visualized skeletal structures are unremarkable. IMPRESSION: Normal chest x-ray. Electronically Signed   By: Rudie MeyerP.  Gallerani M.D.   On: 04/06/2018 11:11    Procedures Procedures (including critical care time)  Medications Ordered in ED Medications  acetaminophen (TYLENOL) tablet 1,000 mg (1,000 mg Oral Given 04/06/18 1108)  ibuprofen (ADVIL,MOTRIN) tablet 800 mg (800 mg Oral Given 04/06/18 1108)  benzonatate (TESSALON) capsule 100 mg (100 mg Oral Given 04/06/18 1108)     Initial Impression / Assessment and Plan / ED Course  I have reviewed the triage vital signs and the nursing notes.  Pertinent labs & imaging results that were available during my care of the patient were reviewed by me and considered in my medical decision making (see chart for details).     27 yo M with URI-like symptoms.  Clear lung sounds on my exam no bacterial source was found.  With patient having right chest pain will obtain a plain film.  Suspect this is a muscular strain from coughing.  CXR negative for pna as viewed by me.  D/c home.   2:28 PM:  I have discussed the diagnosis/risks/treatment options with the patient and believe the pt to be eligible for discharge home to follow-up with PCP. We also discussed returning to the ED immediately if new or worsening sx occur. We discussed the sx which are most concerning (e.g., sudden worsening pain, fever, inability to tolerate by mouth) that necessitate immediate return. Medications administered to the patient during their visit and any new prescriptions provided to the patient are listed below.  Medications given during this visit Medications  acetaminophen (TYLENOL) tablet 1,000 mg (1,000 mg Oral Given  04/06/18 1108)  ibuprofen (ADVIL,MOTRIN) tablet 800 mg (800 mg Oral Given 04/06/18 1108)  benzonatate (TESSALON) capsule 100 mg (100 mg Oral Given 04/06/18 1108)      The patient appears reasonably screen and/or stabilized for discharge and I doubt any other medical condition or other [facility] requiring further screening, evaluation, or treatment in the ED at this time prior to discharge.    Final Clinical Impressions(s) / ED Diagnoses   Final diagnoses:  Viral URI with cough    ED Discharge Orders         Ordered    benzonatate (TESSALON) 100 MG capsule  Every 8 hours     04/06/18 1115           Melene PlanFloyd, Leonidas Boateng, DO 04/06/18 1429

## 2018-04-06 NOTE — Discharge Instructions (Signed)
Take tylenol 2 pills 4 times a day and motrin 4 pills 3 times a day.  Drink plenty of fluids.  Return for worsening shortness of breath, headache, confusion. Follow up with your family doctor.   

## 2018-04-06 NOTE — ED Notes (Signed)
Pt asked what the sxs of "diabetes" were; pt then stated he had those sxs (increased thirst, increased UOP and feeling fatigued)

## 2018-04-14 ENCOUNTER — Emergency Department (HOSPITAL_BASED_OUTPATIENT_CLINIC_OR_DEPARTMENT_OTHER)
Admission: EM | Admit: 2018-04-14 | Discharge: 2018-04-14 | Disposition: A | Discharge status: Home / Self Care | Payer: Self-pay | Attending: Emergency Medicine | Admitting: Emergency Medicine

## 2018-04-14 ENCOUNTER — Encounter (HOSPITAL_BASED_OUTPATIENT_CLINIC_OR_DEPARTMENT_OTHER): Payer: Self-pay | Admitting: *Deleted

## 2018-04-14 ENCOUNTER — Other Ambulatory Visit: Payer: Self-pay

## 2018-04-14 DIAGNOSIS — H9209 Otalgia, unspecified ear: Secondary | ICD-10-CM | POA: Insufficient documentation

## 2018-04-14 DIAGNOSIS — H1032 Unspecified acute conjunctivitis, left eye: Secondary | ICD-10-CM | POA: Insufficient documentation

## 2018-04-14 DIAGNOSIS — F17210 Nicotine dependence, cigarettes, uncomplicated: Secondary | ICD-10-CM | POA: Insufficient documentation

## 2018-04-14 MED ORDER — ERYTHROMYCIN 5 MG/GM OP OINT: TOPICAL_OINTMENT | OPHTHALMIC | 0 refills | Status: AC

## 2018-04-14 MED ORDER — FLUORESCEIN SODIUM 1 MG OP STRP
1.0000 | ORAL_STRIP | Freq: Once | OPHTHALMIC | Status: AC
  Administered 2018-04-14: 1 via OPHTHALMIC
  Filled 2018-04-14: qty 1

## 2018-04-14 MED ORDER — TETRACAINE HCL 0.5 % OP SOLN
2.0000 [drp] | Freq: Once | OPHTHALMIC | Status: AC
  Administered 2018-04-14: 2 [drp] via OPHTHALMIC
  Filled 2018-04-14: qty 4

## 2018-04-14 MED ORDER — FLUTICASONE PROPIONATE 50 MCG/ACT NA SUSP: 1.0000 | Freq: Every day | NASAL | 0 refills | Status: AC

## 2018-04-14 NOTE — Discharge Instructions (Signed)
You are seen in the emergency department today for left eye symptoms.  It is likely that the symptoms are due to a virus, however it may be due to a bacterial infection in the eye.  We are sending you home with an eye ointment that is an antibiotic to use 5 times per day in the left eye.  Please be sure to wash her hands plenty.  We are also sending home with Flonase to help with your nasal congestion, please use 1 spray per nostril daily.  We have prescribed you new medication(s) today. Discuss the medications prescribed today with your pharmacist as they can have adverse effects and interactions with your other medicines including over the counter and prescribed medications. Seek medical evaluation if you start to experience new or abnormal symptoms after taking one of these medicines, seek care immediately if you start to experience difficulty breathing, feeling of your throat closing, facial swelling, or rash as these could be indications of a more serious allergic reaction  Please follow-up with your primary care provider and/or with the ophthalmologist provided your discharge instructions within the next 3 days.  Return to the ER for new or worsening symptoms or any other concerns.

## 2018-04-14 NOTE — ED Triage Notes (Signed)
Left eye drainage since yesterday. Itching.

## 2018-04-14 NOTE — ED Provider Notes (Signed)
[facility] EMERGENCY DEPARTMENT Provider Note   CSN: [id number] Arrival date & time: 04/14/18  1058     History   Chief Complaint Chief Complaint  Patient presents with  . Eye Problem    HPI Joel Mckenzie is a 27 y.o. male with a hx of tobacco abuse who presents to the ED with complaints of L eye sxs since yesterday. Patient states that he woke up with L eye drainage, irritation, erythema, and pruritus. States he has been itching the eye. Sxs are constant, seem to be progressively worsening. This AM the eye was initially crusted shut secondary to drainage. He states that he may have some blurred vision in the L eye but thinks this is secondary to drainage. Upon further questions has had some congestion and L ear pain. No intervention PTA. Patient is not a contact lens wearer. Denies fever, chills, sore throat, nausea, or vomiting.   HPI  History reviewed. No pertinent past medical history.  There are no active problems to display for this patient.   Past Surgical History:  Procedure Laterality Date  . HAND SURGERY          Home Medications    Prior to Admission medications   Medication Sig Start Date End Date Taking? Authorizing Provider  benzonatate (TESSALON) 100 MG capsule Take 1 capsule (100 mg total) by mouth every 8 (eight) hours. 04/06/18   Melene PlanFloyd, Dan, DO    Family History No family history on file.  Social History Social History   Tobacco Use  . Smoking status: Current Every Day Smoker    Packs/day: 1.00    Types: Cigarettes  . Smokeless tobacco: Never Used  Substance Use Topics  . Alcohol use: No    Frequency: Never  . Drug use: Yes    Frequency: 7.0 times per week    Types: Marijuana     Allergies   Patient has no known allergies.   Review of Systems Review of Systems  Constitutional: Negative for chills and fever.  HENT: Positive for congestion and ear pain. Negative for sore throat, trouble swallowing and voice change.     Eyes: Positive for pain, discharge, redness, itching and visual disturbance. Negative for photophobia.  Respiratory: Negative for cough and shortness of breath.   Cardiovascular: Negative for chest pain.  Neurological: Negative for headaches.     Physical Exam Updated Vital Signs BP 134/85   Pulse 64   Temp 98.1 F (36.7 C) (Oral)   Resp 18   Ht 5\' 6"  (1.676 m)   Wt 89.8 kg   SpO2 97%   BMI 31.95 kg/m   Physical Exam  Constitutional: He appears well-developed and well-nourished. No distress.  HENT:  Head: Normocephalic and atraumatic.  Right Ear: Tympanic membrane and ear canal normal. No mastoid tenderness. Tympanic membrane is not perforated, not erythematous, not retracted and not bulging.  Left Ear: Tympanic membrane and ear canal normal. No mastoid tenderness. Tympanic membrane is not perforated, not erythematous, not retracted and not bulging.  Nose: Mucosal edema present.  Mouth/Throat: Uvula is midline and oropharynx is clear and moist.  Eyes: Pupils are equal, round, and reactive to light. Conjunctivae and EOM are normal. Lids are everted and swept, no foreign bodies found. Right eye exhibits no discharge. Left eye exhibits no discharge. Right conjunctiva is not injected. Left conjunctiva is not injected.  No periorbital erythema/edema/tenderness.  Fluorescein stain without uptake- no evidence of corneal abrasion/ulceration. No dendritic staining. No evidence of hyphema or hypopyon.  Initial Visual Acuity:  Bilateral Distance:20/30 (no corrective lenses used for these exams) R Distance:20/50 L Distance:20/100 Repeat Visual Acuity:  Bilateral Distance:20/30 R Distance:20/40 L Distance:20/30    Cardiovascular: Normal rate and regular rhythm.  Pulmonary/Chest: Effort normal and breath sounds normal.  Neurological: He is alert.  Clear speech.   Psychiatric: He has a normal mood and affect. His behavior is normal. Thought content normal.  Nursing note and vitals  reviewed.    ED Treatments / Results  Labs (all labs ordered are listed, but only abnormal results are displayed) Labs Reviewed - No data to display  EKG None  Radiology No results found.  Procedures Procedures (including critical care time)  Medications Ordered in ED Medications - No data to display   Initial Impression / Assessment and Plan / ED Course  I have reviewed the triage vital signs and the nursing notes.  Pertinent labs & imaging results that were available during my care of the patient were reviewed by me and considered in my medical decision making (see chart for details).   Patient presents to the emergency department with left eye irritation, drainage, and erythema.  He is also noted to have some congestion and left ear discomfort.  Patient nontoxic-appearing, no apparent distress, vitals WNL. Patient has an overall benign exam without appreciable abnormalities other than congestion. There is no fluorescein updake on exam, no indication of corneal abrasion/ulceration or HSV. Patient is afebrile and without proptosis, entrapment, or consensual photophobia, no periorbital swelling- doubt periorbital or orbital cellulitis. Repeat visual acuity s/p tetracaine drops without significant deficits. High suspicion for viral process, will provide flonase, and will cover for potential bacterial conjunctivitis with erythromycin ointment- patient is not a contact lens wearer. PCP/ophtho follow up. I discussed results, treatment plan, need for follow-up, and return precautions with the patient. Provided opportunity for questions, patient confirmed understanding and is in agreement with plan.    Final Clinical Impressions(s) / ED Diagnoses   Final diagnoses:  Acute conjunctivitis of left eye, unspecified acute conjunctivitis type    ED Discharge Orders         Ordered    erythromycin ophthalmic ointment     04/14/18 1513    fluticasone (FLONASE) 50 MCG/ACT nasal spray  Daily      04/14/18 1513           Petrucelli, Pleas Koch, PA-C 04/14/18 1516    Benjiman Core, MD 04/15/18 [phone]

## 2019-10-19 ENCOUNTER — Other Ambulatory Visit: Payer: Self-pay

## 2019-10-19 ENCOUNTER — Emergency Department (HOSPITAL_BASED_OUTPATIENT_CLINIC_OR_DEPARTMENT_OTHER)
Admission: EM | Admit: 2019-10-19 | Discharge: 2019-10-19 | Disposition: A | Discharge status: Home / Self Care | Payer: Worker's Compensation | Attending: Emergency Medicine | Admitting: Emergency Medicine

## 2019-10-19 ENCOUNTER — Encounter (HOSPITAL_BASED_OUTPATIENT_CLINIC_OR_DEPARTMENT_OTHER): Payer: Self-pay

## 2019-10-19 DIAGNOSIS — R519 Headache, unspecified: Secondary | ICD-10-CM | POA: Insufficient documentation

## 2019-10-19 DIAGNOSIS — Z5321 Procedure and treatment not carried out due to patient leaving prior to being seen by health care provider: Secondary | ICD-10-CM | POA: Diagnosis not present

## 2019-10-19 DIAGNOSIS — M549 Dorsalgia, unspecified: Secondary | ICD-10-CM | POA: Insufficient documentation

## 2019-10-19 NOTE — ED Notes (Signed)
Pt wants to leave and see own MD in am. Pt states he does not want to see an MD here and wants to be shown how to get out of the treatment area now.
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
## 2019-11-11 IMAGING — DX DG CHEST 2V
2 series · 2 of 2 positions shown · non-contrast
Comparison: 10/19/2014

CLINICAL DATA: Cough and fever for 2 days.

EXAM:
CHEST - 2 VIEW

[chest pa]
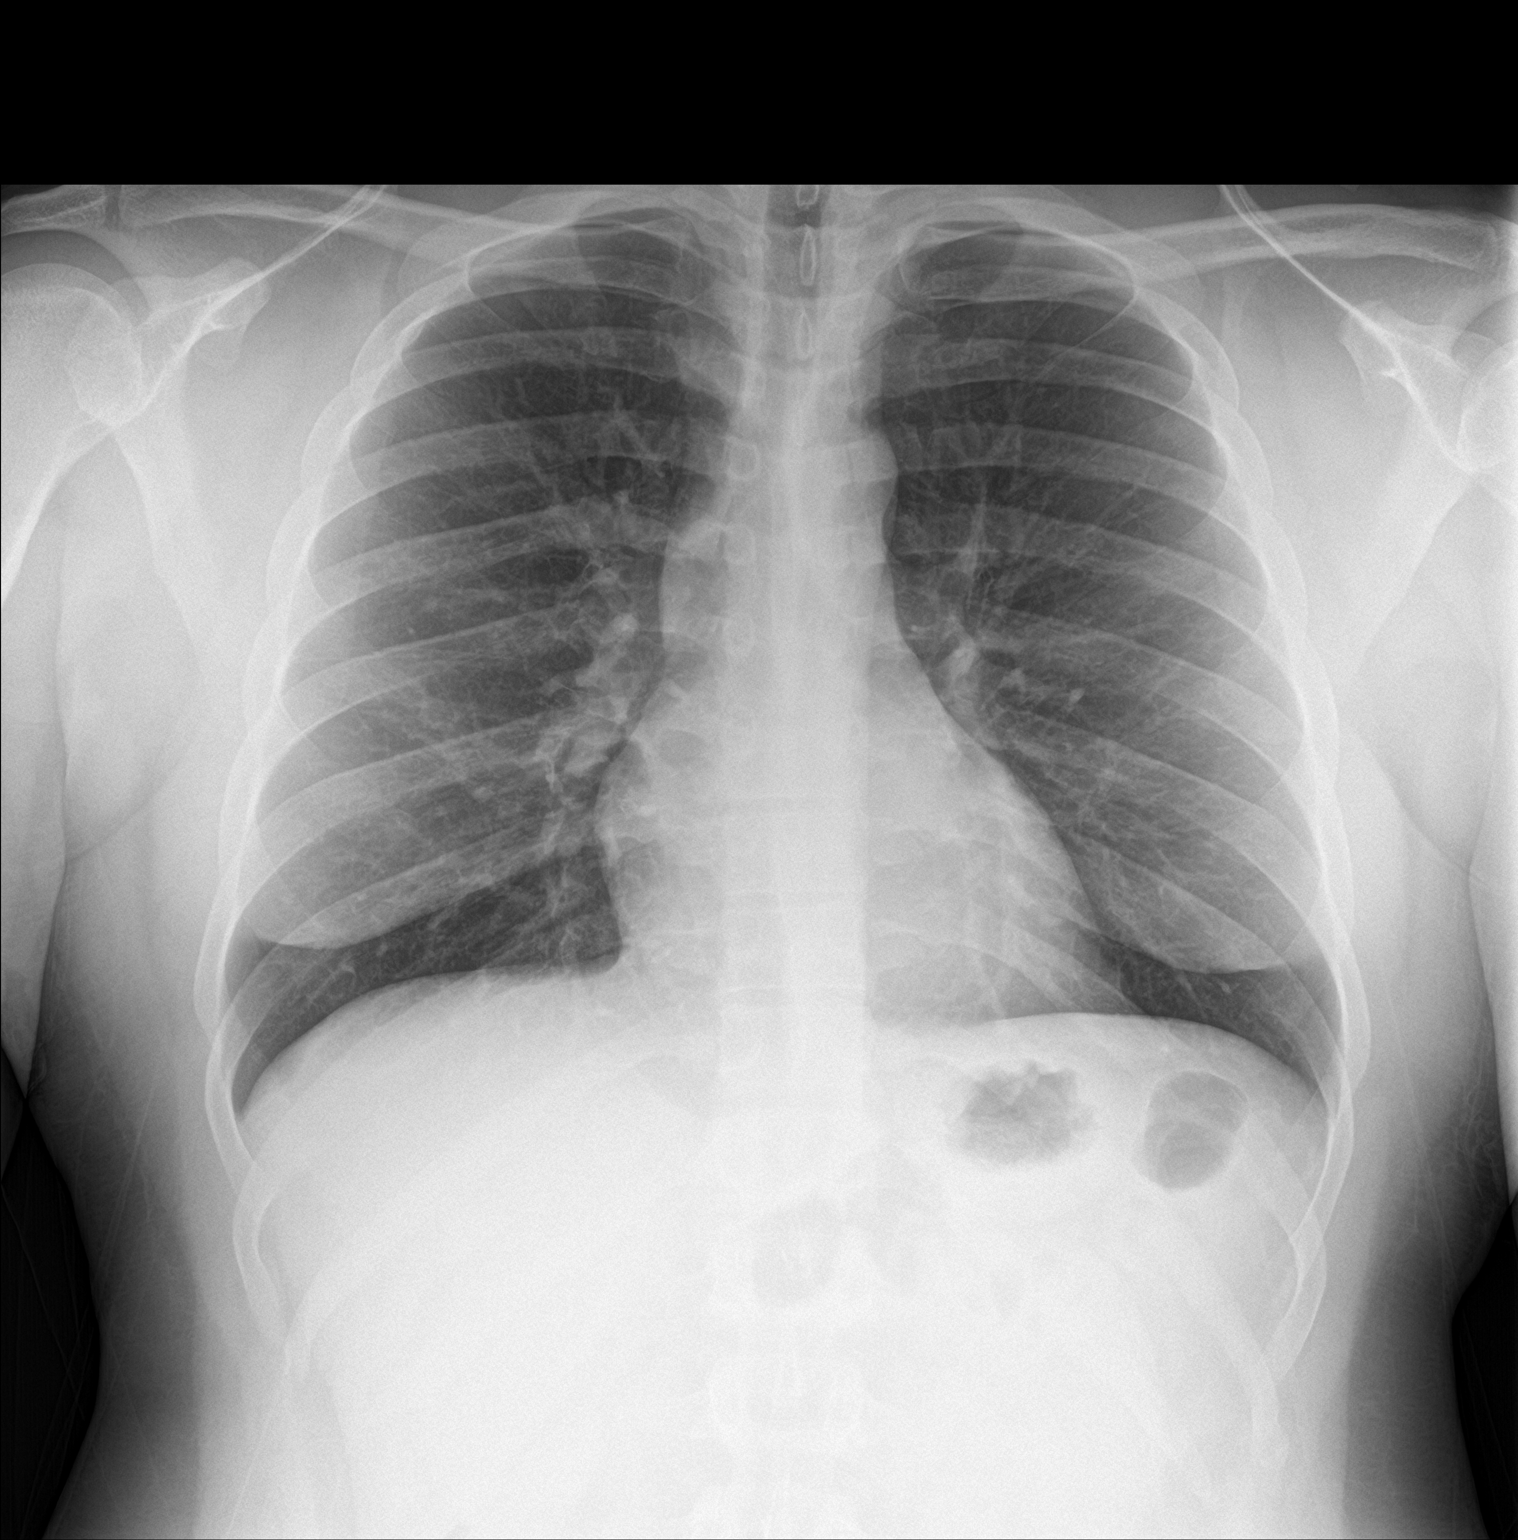

[chest lat]
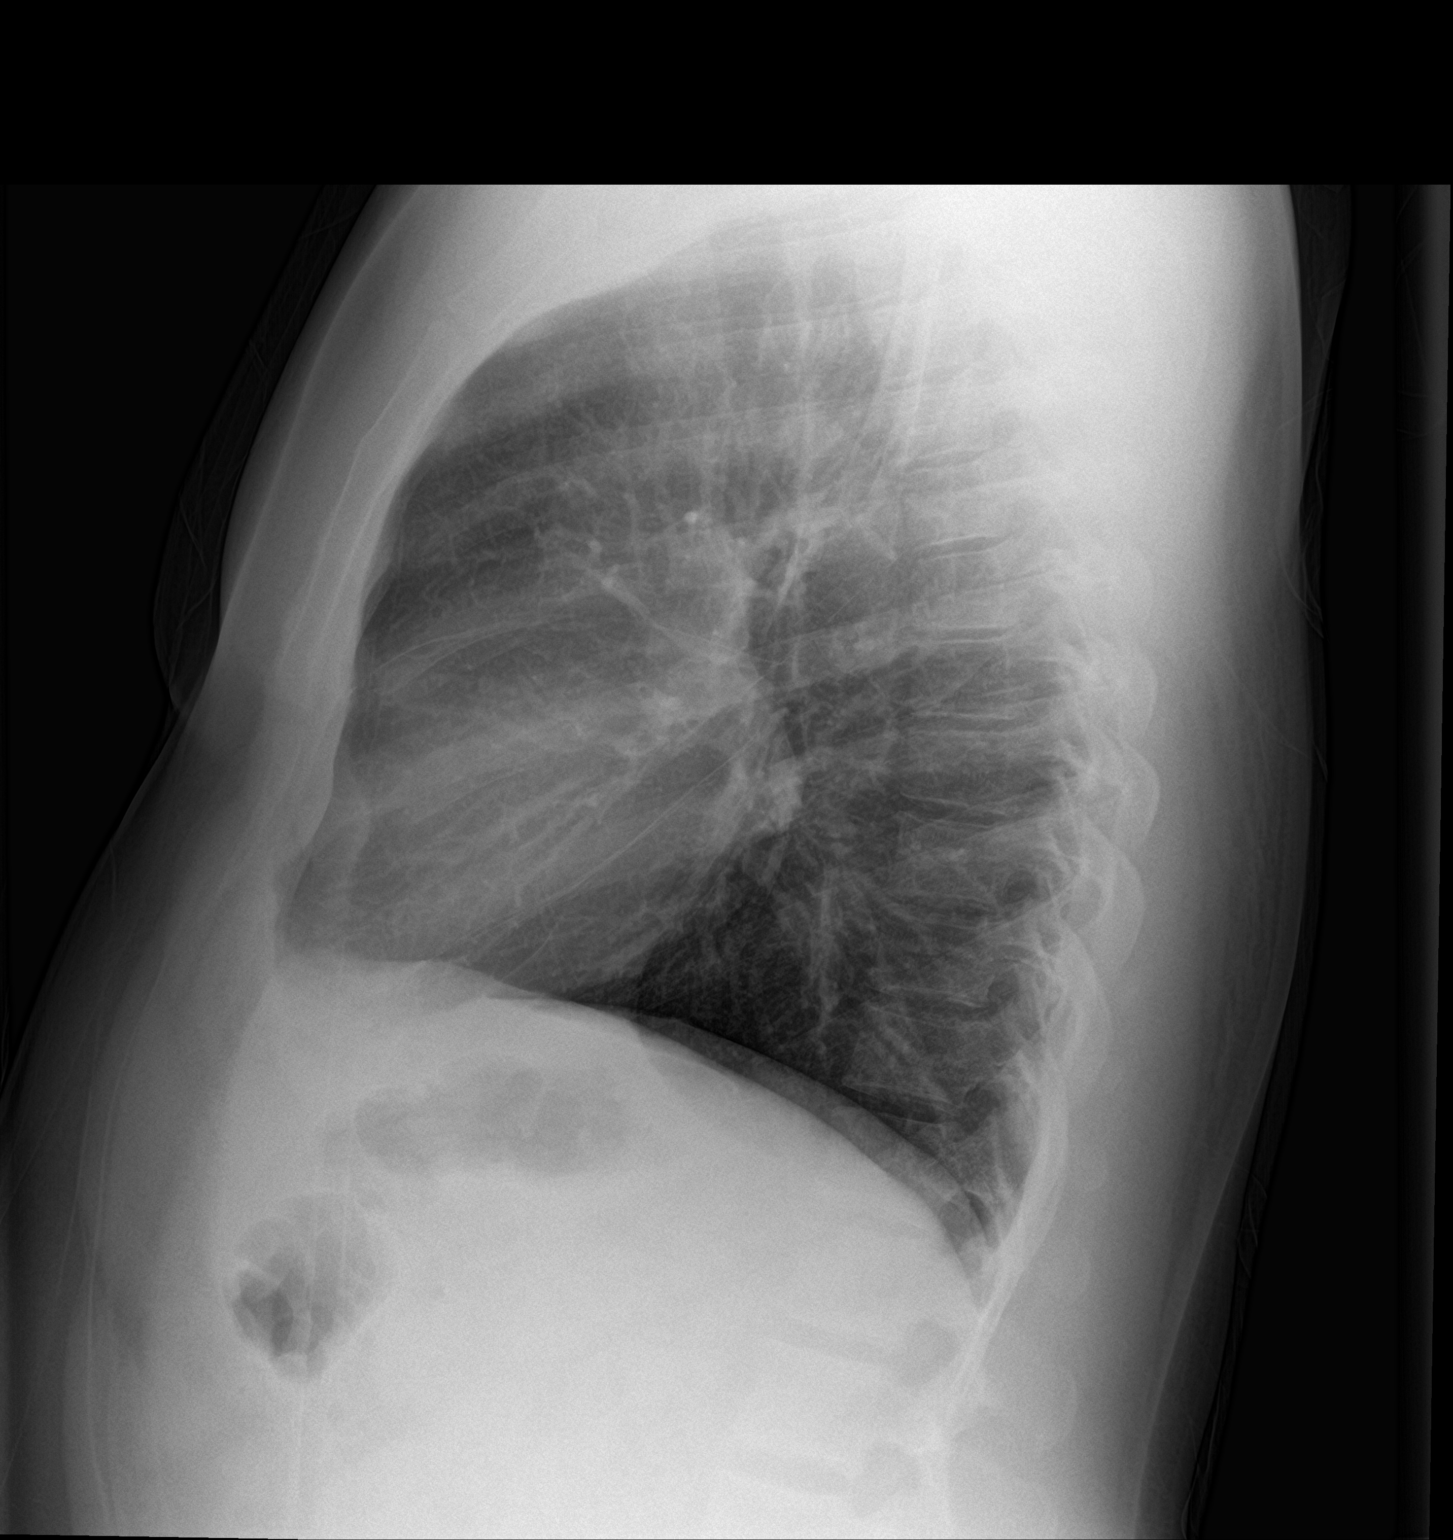

[2 of 2 positions shown; findings below may reference images not displayed]

FINDINGS: The heart size and mediastinal contours are within normal limits.
Both lungs are clear. The visualized skeletal structures are
unremarkable.
IMPRESSION: Normal chest x-ray.
